# Patient Record
Sex: Male | Born: 1960 | Race: White | Hispanic: No | Marital: Married | Smoking: Never smoker
Health system: Southern US, Community
[De-identification: ages and names within clinical notes are randomized; demographics above are authoritative.]

## PROBLEM LIST (undated history)

## (undated) DIAGNOSIS — T783XXA Angioneurotic edema, initial encounter: Secondary | ICD-10-CM

## (undated) DIAGNOSIS — J4520 Mild intermittent asthma, uncomplicated: Secondary | ICD-10-CM

## (undated) HISTORY — DX: Mild intermittent asthma, uncomplicated: J45.20

## (undated) HISTORY — PX: SINOSCOPY: SHX187

## (undated) HISTORY — DX: Angioneurotic edema, initial encounter: T78.3XXA

---

## 2001-01-04 ENCOUNTER — Ambulatory Visit (HOSPITAL_BASED_OUTPATIENT_CLINIC_OR_DEPARTMENT_OTHER): Admission: RE | Admit: 2001-01-04 | Discharge: 2001-01-04 | Payer: Self-pay | Admitting: Otolaryngology

## 2002-12-13 HISTORY — PX: OTHER SURGICAL HISTORY: SHX169

## 2002-12-13 HISTORY — PX: NASAL SEPTUM SURGERY: SHX37

## 2010-03-05 ENCOUNTER — Encounter: Admission: RE | Admit: 2010-03-05 | Discharge: 2010-03-05 | Payer: Self-pay | Admitting: Family Medicine

## 2014-03-08 ENCOUNTER — Emergency Department: Payer: Self-pay | Admitting: Emergency Medicine

## 2018-08-21 ENCOUNTER — Other Ambulatory Visit: Payer: Self-pay | Admitting: Family Medicine

## 2018-08-21 DIAGNOSIS — R1310 Dysphagia, unspecified: Secondary | ICD-10-CM

## 2018-09-11 ENCOUNTER — Ambulatory Visit
Admission: RE | Admit: 2018-09-11 | Discharge: 2018-09-11 | Disposition: A | Discharge status: Home / Self Care | Payer: Managed Care, Other (non HMO) | Source: Ambulatory Visit | Attending: Family Medicine | Admitting: Family Medicine

## 2018-09-11 DIAGNOSIS — R1310 Dysphagia, unspecified: Secondary | ICD-10-CM

## 2018-12-26 ENCOUNTER — Ambulatory Visit (INDEPENDENT_AMBULATORY_CARE_PROVIDER_SITE_OTHER): Payer: Managed Care, Other (non HMO) | Admitting: Allergy & Immunology

## 2018-12-26 ENCOUNTER — Encounter: Payer: Self-pay | Admitting: Allergy & Immunology

## 2018-12-26 VITALS — BP 112/76 | HR 74 | Temp 97.9°F | Resp 16 | Ht 69.5 in | Wt 190.8 lb

## 2018-12-26 DIAGNOSIS — J302 Other seasonal allergic rhinitis: Secondary | ICD-10-CM | POA: Diagnosis not present

## 2018-12-26 DIAGNOSIS — J3089 Other allergic rhinitis: Secondary | ICD-10-CM | POA: Diagnosis not present

## 2018-12-26 DIAGNOSIS — J4520 Mild intermittent asthma, uncomplicated: Secondary | ICD-10-CM | POA: Diagnosis not present

## 2018-12-26 HISTORY — DX: Mild intermittent asthma, uncomplicated: J45.20

## 2018-12-26 MED ORDER — FLUTICASONE PROPIONATE 93 MCG/ACT NA EXHU: 2.0000 | INHALANT_SUSPENSION | Freq: Two times a day (BID) | NASAL | 5 refills | Status: DC

## 2018-12-26 NOTE — Patient Instructions (Addendum)
1. Seasonal and perennial allergic rhinitis - Testing today showed: grasses, indoor molds, outdoor molds and dog - Copy of test results provided.  - Avoidance measures provided. - Start taking: Xyzal (levocetirizine) 5mg  tablet once daily and Xhance (fluticasone) 1-2 sprays per nostril twice daily - You can use an extra dose of the antihistamine, if needed, for breakthrough symptoms.  - Consider nasal saline rinses 1-2 times daily to remove allergens from the nasal cavities as well as help with mucous clearance (this is especially helpful to do before the nasal sprays are given) - Consider allergy shots as a means of long-term control. - Allergy shots "re-train" and "reset" the immune system to ignore environmental allergens and decrease the resulting immune response to those allergens (sneezing, itchy watery eyes, runny nose, nasal congestion, etc).    - Allergy shots improve symptoms in 75-85% of patients.  - We can discuss more at the next appointment if the medications are not working for you.  2. Return in about 3 months (around 03/27/2019).   Please inform us of any Emergency Department visits, hospitalizations, or changes in symptoms. Call us before going to the ED for breathing or allergy symptoms since we might be able to fit you in for a sick visit. Feel free to contact us anytime with any questions, problems, or concerns.  It was a pleasure to meet you today!  Websites that have reliable patient information: 1. American Academy of Asthma, Allergy, and Immunology: www.aaaai.org 2. Food Allergy Research and Education (FARE): foodallergy.org 3. Mothers of Asthmatics: http://www.asthmacommunitynetwork.org 4. American College of Allergy, Asthma, and Immunology: MissingWeapons.ca   Make sure you are registered to vote! If you have moved or changed any of your contact information, you will need to get this updated before voting!    Voter ID laws are going into effect for the General  Election in November 2020! Be prepared! Check out LandscapingDigest.dk for more details.       Control of Mold Allergen   Mold and fungi can grow on a variety of surfaces provided certain temperature and moisture conditions exist.  Outdoor molds grow on plants, decaying vegetation and soil.  The major outdoor mold, Alternaria and Cladosporium, are found in very high numbers during hot and dry conditions.  Generally, a late Summer - Fall peak is seen for common outdoor fungal spores.  Rain will temporarily lower outdoor mold spore count, but counts rise rapidly when the rainy period ends.  The most important indoor molds are Aspergillus and Penicillium.  Dark, humid and poorly ventilated basements are ideal sites for mold growth.  The next most common sites of mold growth are the bathroom and the kitchen.  Outdoor (Seasonal) Mold Control  Positive outdoor molds via skin testing: Alternaria, Cladosporium, Bipolaris (Helminthsporium), Drechslera (Curvalaria) and Mucor  1. Use air conditioning and keep windows closed 2. Avoid exposure to decaying vegetation. 3. Avoid leaf raking. 4. Avoid grain handling. 5. Consider wearing a face mask if working in moldy areas.  6.   Indoor (Perennial) Mold Control   Positive indoor molds via skin testing: Aspergillus, Penicillium, Fusarium, Aureobasidium (Pullulara) and Rhizopus  1. Maintain humidity below 50%. 2. Clean washable surfaces with 5% bleach solution. 3. Remove sources e.g. contaminated carpets.    Reducing Pollen Exposure  The American Academy of Allergy, Asthma and Immunology suggests the following steps to reduce your exposure to pollen during allergy seasons.    1. Do not hang sheets or clothing out to dry; pollen may collect  on these items. 2. Do not mow lawns or spend time around freshly cut grass; mowing stirs up pollen. 3. Keep windows closed at night.  Keep car windows closed while driving. 4. Minimize morning  activities outdoors, a time when pollen counts are usually at their highest. 5. Stay indoors as much as possible when pollen counts or humidity is high and on windy days when pollen tends to remain in the air longer. 6. Use air conditioning when possible.  Many air conditioners have filters that trap the pollen spores. 7. Use a HEPA room air filter to remove pollen form the indoor air you breathe.  Control of Dog or Cat Allergen  Avoidance is the best way to manage a dog or cat allergy. If you have a dog or cat and are allergic to dog or cats, consider removing the dog or cat from the home. If you have a dog or cat but don't want to find it a new home, or if your family wants a pet even though someone in the household is allergic, here are some strategies that may help keep symptoms at bay:  1. Keep the pet out of your bedroom and restrict it to only a few rooms. Be advised that keeping the dog or cat in only one room will not limit the allergens to that room. 2. Don't pet, hug or kiss the dog or cat; if you do, wash your hands with soap and water. 3. High-efficiency particulate air (HEPA) cleaners run continuously in a bedroom or living room can reduce allergen levels over time. 4. Regular use of a high-efficiency vacuum cleaner or a central vacuum can reduce allergen levels. 5. Giving your dog or cat a bath at least once a week can reduce airborne allergen.

## 2018-12-26 NOTE — Progress Notes (Signed)
NEW PATIENT  Date of Service/Encounter:  12/26/18  Referring provider: Shaune PollackGates, Donna, MD (Inactive)   Assessment:   Seasonal and perennial allergic rhinitis (grasses, indoor molds, outdoor molds and dog)  Eustachian tube dysfunction  Plan/Recommendations:   1. Seasonal and perennial allergic rhinitis - Testing today showed: grasses, indoor molds, outdoor molds and dog - Copy of test results provided.  - Avoidance measures provided. - Start taking: Xyzal (levocetirizine) 5mg  tablet once daily and Xhance (fluticasone) 1-2 sprays per nostril twice daily - You can use an extra dose of the antihistamine, if needed, for breakthrough symptoms.  - Consider nasal saline rinses 1-2 times daily to remove allergens from the nasal cavities as well as help with mucous clearance (this is especially helpful to do before the nasal sprays are given) - Consider allergy shots as a means of long-term control. - Allergy shots "re-train" and "reset" the immune system to ignore environmental allergens and decrease the resulting immune response to those allergens (sneezing, itchy watery eyes, runny nose, nasal congestion, etc).    - Allergy shots improve symptoms in 75-85% of patients.  - We can discuss more at the next appointment if the medications are not working for you.  2. Return in about 3 months (around 03/27/2019).  Subjective:   Carlos Lindsey is a 58 y.o. male presenting today for evaluation of  Chief Complaint  Patient presents with  . New Patient (Initial Visit)  . Allergies  . Nasal Congestion    Carlos Lindsey has a history of the following: Patient Active Problem List   Diagnosis Date Noted  . Mild intermittent asthma, uncomplicated 12/26/2018  . Seasonal and perennial allergic rhinitis 12/26/2018    History obtained from: chart review and patient.  Carlos Lindsey was referred by Shaune PollackGates, Donna, MD (Inactive).     Carlos Lindsey is a 58 y.o. male presenting for an evaluation  of allergic rhinitis.    He was seen by Dr. Annalee GentaShoemaker in March 2019.  At that time, he was diagnosed with eustachian tube dysfunction and right ear fullness.  He also was complaining of muffled hearing and ear pressure.  There was no evidence of effusion or active infection.  He was continued with nasal saline rinses and Dymista to improve eustachian tube patency.   He was on allergy shots from 1968 through 1980. He was on them for 11 years total. He stopped because the "serum was too concentrated" and needed something stronger. He was told that he no longer needed to "do it". Symptoms have not gotten much worse since that time. [place] allerg[place](Claude Fraiser). He has not seen an allergist since 1980. He was definitely allergic to trees, grasses, animal hair, dust, and other pollens.   He has noticed that when he goes to a new environment with a variety of different pollens, he will have horrible allergy symptoms with ocular involvement and rhinorrhea. He travels over [place] for his job and he notices that when he stays at different hotels, his symptoms worsen. He does get antibiotics around twice per year. He did have a septal deviation years and his infections decreased quite a bit. Symptoms are now worse in the spring and the fall. He d  Now he is on Flonase. He did not continue with Dymista since he needed a refill. He is unsure whether the Dymista worked any better than the Flonase alone.   He does have some irritation with increased drainage. He does  use a rescue inhaler often at all, he estimates every 6 months or so. He did have food allergies when he saw the allergist in [place]. However it does not seem that he ever had any clinical symptoms of food allergies.     Otherwise, there is no history of other atopic diseases, including asthma, drug allergies, stinging insect allergies, eczema or urticaria. There is no significant infectious history. Vaccinations are up to  date.    Past Medical History: Patient Active Problem List   Diagnosis Date Noted  . Mild intermittent asthma, uncomplicated 12/26/2018  . Seasonal and perennial allergic rhinitis 12/26/2018    Medication List:  Allergies as of 12/26/2018      Reactions   Lisinopril Swelling   Facial Swelling    Penicillins Diarrhea      Medication List       Accurate as of December 26, 2018  7:41 PM. Always use your most recent med list.        amLODipine-atorvastatin 5-10 MG tablet Commonly known as:  CADUET Take 1 tablet by mouth daily.   atorvastatin 10 MG tablet Commonly known as:  LIPITOR Take 10 mg by mouth daily.   Fluticasone Propionate 93 MCG/ACT Exhu Commonly known as:  XHANCE Place 2 sprays into the nose 2 (two) times daily.   lansoprazole 30 MG capsule Commonly known as:  PREVACID Take 30 mg by mouth daily at 12 noon. Take 1 tablet every 2 days   Melatonin 2.5 MG Chew Chew 1 tablet by mouth as needed.   pseudoephedrine-acetaminophen 30-500 MG Tabs tablet Commonly known as:  TYLENOL SINUS Take 1 tablet by mouth daily.       Birth History: non-contributory  Developmental History: non-contributory.   Past Surgical History: Past Surgical History:  Procedure Laterality Date  . NASAL SEPTUM SURGERY N/A 2004  . SINOSCOPY    . uvula removal N/A 2004     Family History: Family History  Problem Relation Age of Onset  . Allergic rhinitis Mother   . Asthma Father   . Allergic rhinitis Brother      Social History: Carlos Lindsey lives at home with his family. He lives in a house that is 59 years old. There is carpeting in the main living areas and wood in the bedrooms. He has gas heating and central cooling. There are dust mite coverings on the bedding including the pillows. There is no tobacco exposure. 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      Review of  Systems: a 14-point review of systems is pertinent for what is mentioned in HPI.  Otherwise, all other systems were negative. Constitutional: negative other than that listed in the HPI Eyes: negative other than that listed in the HPI Ears, nose, mouth, throat, and face: negative other than that listed in the HPI Respiratory: negative other than that listed in the HPI Cardiovascular: negative other than that listed in the HPI Gastrointestinal: negative other than that listed in the HPI Genitourinary: negative other than that listed in the HPI Integument: negative other than that listed in the HPI Hematologic: negative other than that listed in the HPI Musculoskeletal: negative other than that listed in the HPI Neurological: negative other than that listed in the HPI Allergy/Immunologic: negative other than that listed in the HPI    Objective:   Blood pressure 112/76, pulse 74, temperature 97.9 F (36.6 C), temperature source Oral, resp. rate  16, height 5' 9.5" (1.765 m), weight 190 lb 12.8 oz (86.5 kg), SpO2 96 %. Body mass index is 27.77 kg/m.   Physical Exam:  General: Alert, interactive, in no acute distress. Pleasant male.  Eyes: No conjunctival injection bilaterally, no discharge on the right, no discharge on the left and no Horner-Trantas dots present. PERRL bilaterally. EOMI without pain. No photophobia.  Ears: Right TM pearly gray with normal light reflex, Left TM pearly gray with normal light reflex, Right TM intact without perforation and Left TM intact without perforation.  Nose/Throat: External nose within normal limits and nasal crease present. Turbinates edematous and pale with clear discharge. Posterior oropharynx erythematous with cobblestoning in the posterior oropharynx. Tonsils 2+ without exudates.  Tongue without thrush. Neck: Supple without thyromegaly. Trachea midline. Adenopathy: no enlarged lymph nodes appreciated in the anterior cervical, occipital, axillary,  epitrochlear, inguinal, or popliteal regions. Lungs: Clear to auscultation without wheezing, rhonchi or rales. No increased work of breathing. CV: Normal S1/S2. No murmurs. Capillary refill <2 seconds.  Abdomen: Nondistended, nontender. No guarding or rebound tenderness. Bowel sounds present in all fields and hypoactive  Skin: Warm and dry, without lesions or rashes. Extremities:  No clubbing, cyanosis or edema. Neuro:   Grossly intact. No focal deficits appreciated. Responsive to questions.  Diagnostic studies:   Allergy Studies:   Airborne Adult Perc - 01-13-19 1503    Time Antigen Placed  1500    Allergen Manufacturer  Waynette Buttery    Location  Back    Number of Test  59    Panel 1  Select    1. Control-Buffer 50% Glycerol  Negative    2. Control-Histamine 1 mg/ml  2+    3. Albumin saline  Negative    4. Bahia  Negative    5. [place]  Negative    6. Johnson  Negative    7. Kentucky Blue  Negative    8. Meadow Fescue  Negative    9. Perennial Rye  Negative    10. Sweet Vernal  Negative    11. Timothy  Negative    12. Cocklebur  Negative    13. Burweed Marshelder  Negative    14. Ragweed, short  Negative    15. Ragweed, Giant  Negative    16. Plantain,  English  Negative    17. Lamb's Quarters  Negative    18. Sheep Sorrell  Negative    19. Rough Pigweed  Negative    20. Marsh Elder, Rough  Negative    21. Mugwort, Common  Negative    22. Ash mix  Negative    23. Birch mix  Negative    24. Beech American  Negative    25. Box, Elder  Negative    26. Cedar, red  Negative    27. Cottonwood, Guinea-Bissau  Negative    28. Elm mix  Negative    29. Hickory mix  Negative    30. Maple mix  Negative    31. Oak, Guinea-Bissau mix  Negative    32. Pecan Pollen  Negative    33. Pine mix  Negative    34. Sycamore Eastern  Negative    35. Walnut, Black Pollen  Negative    36. Alternaria alternata  Negative    37. Cladosporium Herbarum  Negative    38. Aspergillus mix  Negative    39.  Penicillium mix  Negative    40. Bipolaris sorokiniana (Helminthosporium)  Negative    41. Drechslera spicifera (  Curvularia)  Negative    42. Mucor plumbeus  Negative    43. Fusarium moniliforme  Negative    44. Aureobasidium pullulans (pullulara)  Negative    45. Rhizopus oryzae  Negative    46. Botrytis cinera  Negative    47. Epicoccum nigrum  Negative    48. Phoma betae  Negative    49. Candida Albicans  Negative    50. Trichophyton mentagrophytes  Negative    51. Mite, D Farinae  5,000 AU/ml  Negative    52. Mite, D Pteronyssinus  5,000 AU/ml  Negative    53. Cat Hair 10,000 BAU/ml  Negative    54.  Dog Epithelia  Negative    55. Mixed Feathers  Negative    56. Horse Epithelia  Negative    57. Cockroach, German  Negative    58. Mouse  Negative    59. Tobacco Leaf  Negative     Intradermal - 12/26/18 1541    Time Antigen Placed  1540    Allergen Manufacturer  Waynette ButteryGreer    Location  Arm    Number of Test  15    Intradermal  Select    Control  Negative    French Southern Territ[place]  1+    Johnson  1+    7 Grass  1+    Ragweed mix  Negative    Weed mix  Negative    Tree mix  Negative    Mold 1  3+    Mold 2  3+    Mold 3  2+    Mold 4  2+    Cat  Negative    Dog  1+    Cockroach  Negative    Mite mix  Negative        Allergy testing results were read and interpreted by myself, documented by clinical staff.       Malachi BondsJoel Leah Thornberry, MD [facility]

## 2019-03-27 ENCOUNTER — Ambulatory Visit: Payer: Managed Care, Other (non HMO) | Admitting: Allergy & Immunology

## 2019-03-29 ENCOUNTER — Ambulatory Visit: Payer: Managed Care, Other (non HMO) | Admitting: Allergy & Immunology

## 2019-04-26 ENCOUNTER — Ambulatory Visit (INDEPENDENT_AMBULATORY_CARE_PROVIDER_SITE_OTHER): Payer: Managed Care, Other (non HMO) | Admitting: Allergy & Immunology

## 2019-04-26 ENCOUNTER — Other Ambulatory Visit: Payer: Self-pay

## 2019-04-26 ENCOUNTER — Encounter: Payer: Self-pay | Admitting: Allergy & Immunology

## 2019-04-26 VITALS — BP 132/74 | HR 76 | Temp 97.5°F | Resp 16

## 2019-04-26 DIAGNOSIS — J3089 Other allergic rhinitis: Secondary | ICD-10-CM | POA: Diagnosis not present

## 2019-04-26 DIAGNOSIS — J302 Other seasonal allergic rhinitis: Secondary | ICD-10-CM

## 2019-04-26 DIAGNOSIS — H6123 Impacted cerumen, bilateral: Secondary | ICD-10-CM | POA: Diagnosis not present

## 2019-04-26 MED ORDER — MONTELUKAST SODIUM 10 MG PO TABS: 10.0000 mg | ORAL_TABLET | Freq: Every day | ORAL | 5 refills | Status: DC

## 2019-04-26 MED ORDER — LEVOCETIRIZINE DIHYDROCHLORIDE 5 MG PO TABS: 5.0000 mg | ORAL_TABLET | Freq: Every evening | ORAL | 5 refills | Status: DC

## 2019-04-26 MED ORDER — EPINEPHRINE 0.3 MG/0.3ML IJ SOAJ: 0.3000 mg | INTRAMUSCULAR | 1 refills | Status: AC | PRN

## 2019-04-26 MED ORDER — BUDESONIDE 0.25 MG/2ML IN SUSP
RESPIRATORY_TRACT | 5 refills | Status: DC
Start: 2019-04-26 — End: 2019-06-06

## 2019-04-26 NOTE — Patient Instructions (Addendum)
1. Seasonal and perennial allergic rhinitis (grasses, indoor molds, outdoor molds and dog) - Stop the Flonase and start budesonide nasal rinses twice daily (see recipe below). - Start the Xyzal 5mg  and montelukast 10mg  daily. - Information on allergy shots provided.  - Talk to your insurance company about coverage.   2. Cerumen impaction - Cerumen cleared bilaterally with instrumentation. - Syringe provided and rinsing with 1:1 hydrogen peroxide and water recommended. - Use of olive oil as a lubricant for natural cerumen removal discussed as well.  3. Return in about 3 months (around 07/27/2019). This can be an in-person, a virtual Webex or a telephone follow up visit.   Please inform us of any Emergency Department visits, hospitalizations, or changes in symptoms. Call us before going to the ED for breathing or allergy symptoms since we might be able to fit you in for a sick visit. Feel free to contact us anytime with any questions, problems, or concerns.  It was a pleasure to see you again today!  Websites that have reliable patient information: 1. American Academy of Asthma, Allergy, and Immunology: www.aaaai.org 2. Food Allergy Research and Education (FARE): foodallergy.org 3. Mothers of Asthmatics: http://www.asthmacommunitynetwork.org 4. American College of Allergy, Asthma, and Immunology: www.acaai.org  "Like" [place] on Facebook and Instagram for our latest updates!      Make sure you are registered to vote! If you have moved or changed any of your contact information, you will need to get this updated before voting!    Voter ID laws are NOT going into effect for the General Election in November 2020! DO NOT let this stop you from exercising your right to vote!    Budesonide (Pulmicort) + Saline Irrigation/Rinse   Budesonide (Pulmicort) is an anti-inflammatory steroid medication used to decrease nasal and sinus inflammation. It is dispensed in liquid form in a vial. Although it  is manufactured for use with a nebulizer, we intend for you to use it with the NeilMed Sinus Rinse bottle (preferred) or a Neti pot.    Instructions:  1) Make 240cc of saline in the NeilMed bottle using the salt packets or your own saline recipe (see separate handout).  2) Add the entire 2cc vial of liquid Budesonide (Pulmicort) to the rinse bottle and mix together.  3) While in the shower or over the sink, tilt your head forward to a comfortable level. Put the tip of the sinus rinse bottle in your nostril and aim it towards the crown or top of your head. Gently squeeze the bottle to flush out your nose. The fluid will circulate in and out of your sinus cavities, coming back out from either nostril or through your mouth. Try not to swallow large quantities and spit it out instead.  4) Perform Budesonide (Pulmicort) + Saline irrigations 2 times daily.

## 2019-04-26 NOTE — Progress Notes (Signed)
FOLLOW UP  Date of Service/Encounter:  04/26/19   Assessment:   Seasonal and perennial allergic rhinitis (grasses, indoor molds, outdoor molds and dog)  Eustachian tube dysfunction  Bilateral cerumen impaction - cleared in clinic today   Carlos Lindsey remains essentially unchanged since I last saw him.  Different nasal spray we tried him on did not do any better than fluticasone, so he is gone back to the fluticasone only.  He is not really taking his antihistamine and would like a prescription sent in for this.  We are also going to add on montelukast.  Regarding his nasal spray, we will initiate budesonide nasal rinses twice daily.  We did provide a recipe for the salt water and sent in a prescription for the budesonide.  He does need a NeilMed bottle to use for the lavages themselves.  We also discussed allergen immunotherapy and he did sign a consent to start.  He has been on this in the distant past and is open to that since nothing else seems to have worked.  He is good to check with his insurance company regarding coverage and give [place] a call back when he makes a decision.  Plan/Recommendations:   1. Seasonal and perennial allergic rhinitis (grasses, indoor molds, outdoor molds and dog) - Stop the Flonase and start budesonide nasal rinses twice daily (see recipe below). - Start the Xyzal 5mg  and montelukast 10mg  daily. - Information on allergy shots provided.  - Talk to your insurance company about coverage.   2. Cerumen impaction - Cerumen cleared bilaterally with instrumentation. - Syringe provided and rinsing with 1:1 hydrogen peroxide and water recommended. - Use of olive oil as a lubricant for natural cerumen removal discussed as well.  3. Return in about 3 months (around 07/27/2019). This can be an in-person, a virtual Webex or a telephone follow up visit.  Subjective:   Carlos Lindsey is a 58 y.o. male presenting today for follow up of  Chief Complaint  Patient  presents with  . Allergic Rhinitis     he has had some allergy issues on a pretty constant basis. has lots of post nasal drainage.     Carlos Lindsey has a history of the following: Patient Active Problem List   Diagnosis Date Noted  . Mild intermittent asthma, uncomplicated 12/26/2018  . Seasonal and perennial allergic rhinitis 12/26/2018    History obtained from: chart review and patient.  PCP: Dr. Shaune Pollack ENT: Dr. Osborn Coho  Carlos Lindsey is a 58 y.o. male presenting for a follow up visit.  He has a history of seasonal and perennial allergic rhinitis and eustachian tube dysfunction.  I saw him in January 2020 as a new patient.  At that time, testing showed sensitizations to grasses, indoor and outdoor molds, and dog.  We did provide avoidance measures and started him on Xyzal 5 mg daily and Xhance 1 to 2 sprays per nostril up to twice daily.  We did discuss allergen immunotherapy as well.  He had previously been on allergy shots from 1968 through 1980.  Since the last visit, he has not really changed much. He tells me that the [place] did nothing to help his symptoms. He is now using the Flonase twice daily. He has never done budesonide trinses but he has done Netti pots in the past. They do not seem to do much.   He has not seen Dr. Annalee Genta in a while. He did have a sinus CT at one point.  He tells me htat he has a zit on the left sisde of his nose. He did undergo a septoplasty at one point and had a uvular removal 15 years ago.   Otherwise, there have been no changes to his past medical history, surgical history, family history, or social history.    Review of Systems  Constitutional: Negative.  Negative for fever, malaise/fatigue and weight loss.  HENT: Positive for congestion. Negative for ear discharge and ear pain.        Positive for ear fullness.  Eyes: Negative for pain, discharge and redness.  Respiratory: Negative for cough, sputum production, shortness of breath  and wheezing.   Cardiovascular: Negative.  Negative for chest pain and palpitations.  Gastrointestinal: Negative for abdominal pain and heartburn.  Skin: Negative.  Negative for itching and rash.  Neurological: Negative for dizziness and headaches.  Endo/Heme/Allergies: Negative for environmental allergies. Does not bruise/bleed easily.       Objective:   Blood pressure 132/74, pulse 76, temperature (!) 97.5 F (36.4 C), temperature source Oral, resp. rate 16, SpO2 96 %. There is no height or weight on file to calculate BMI.   Physical Exam:  Physical Exam  Constitutional: He appears well-developed.  HENT:  Head: Normocephalic and atraumatic.  Right Ear: Tympanic membrane, external ear and ear canal normal.  Left Ear: Tympanic membrane and ear canal normal.  Nose: No mucosal edema, rhinorrhea, nasal deformity or septal deviation. No epistaxis. Right sinus exhibits no maxillary sinus tenderness and no frontal sinus tenderness. Left sinus exhibits no maxillary sinus tenderness and no frontal sinus tenderness.  Mouth/Throat: Uvula is midline and oropharynx is clear and moist. Mucous membranes are not pale and not dry.  There is cerumen bilaterally that was obstructing the view of the tympanic membrane.  This was removed via instrumentation and patient tolerated the procedure well.  Eyes: Pupils are equal, round, and reactive to light. Conjunctivae and EOM are normal. Right eye exhibits no chemosis and no discharge. Left eye exhibits no chemosis and no discharge. Right conjunctiva is not injected. Left conjunctiva is not injected.  Cardiovascular: Normal rate, regular rhythm and normal heart sounds.  Respiratory: Effort normal and breath sounds normal. No accessory muscle usage. No tachypnea. No respiratory distress. He has no wheezes. He has no rhonchi. He has no rales. He exhibits no tenderness.  Lymphadenopathy:    He has no cervical adenopathy.  Neurological: He is alert.  Skin: No  abrasion, no petechiae and no rash noted. Rash is not papular, not vesicular and not urticarial. No erythema. No pallor.  Psychiatric: He has a normal mood and affect.     Diagnostic studies: none   Procedures: Cerumen removed bilaterally using instrumentation.  Patient tolerated the procedure well.  There was no bleeding.  No flushing was required.      Malachi BondsJoel Cheralyn Oliver, MD  [facility]

## 2019-05-15 IMAGING — RF DG ESOPHAGUS
8 series · 14 of 24 positions shown · non-contrast
Comparison: 03/05/2010 esophagram

CLINICAL DATA: Chronic dysphagia with sticking sensation in the
lower chest with food.

EXAM:
ESOPHOGRAM / BARIUM SWALLOW / BARIUM TABLET STUDY
TECHNIQUE: Combined double contrast and single contrast examination performed
using effervescent crystals, thick barium liquid, and thin barium
liquid. The patient was observed with fluoroscopy swallowing a 13 mm
barium sulphate tablet.
FLUOROSCOPY TIME:  Fluoroscopy Time:  2 minutes 54 seconds
Radiation Exposure Index (if provided by the fluoroscopic device):
156 mGy
Number of Acquired Spot Images: 8

[Series 1: sequence · 2 of 14 frames shown (1 of 7)]
[frame 1/14]
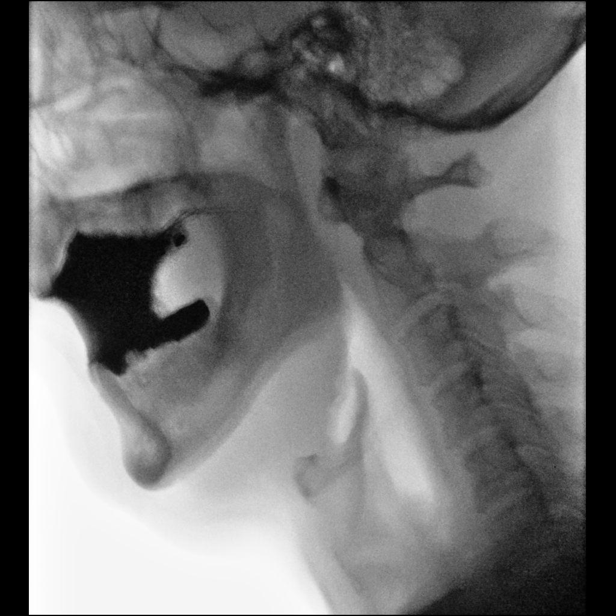
[frame 12/14]
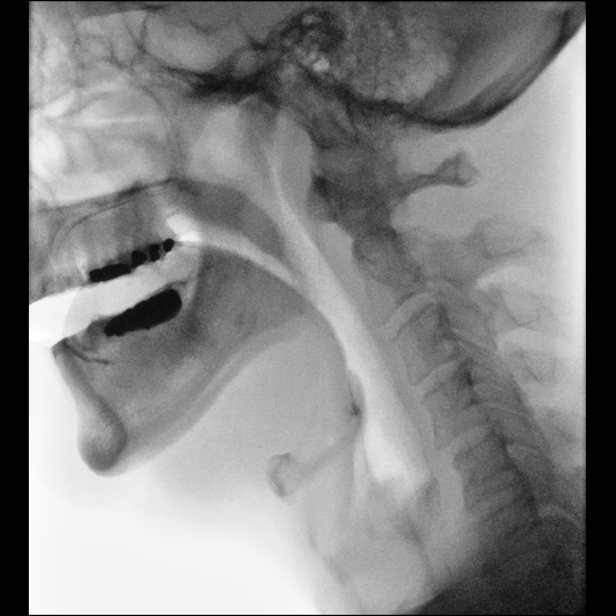

[Series 3: sequence · 1 of 9 frames shown (2 of 7)]
[frame 3/9]
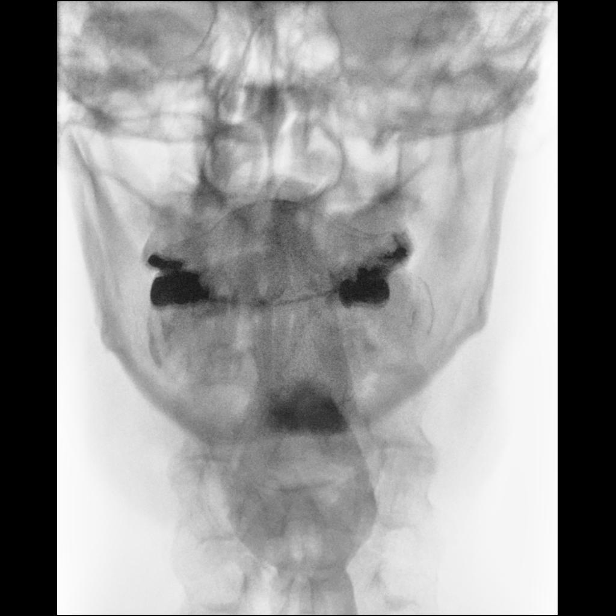

[Series 4: one shot · 0.15mm/px · 3 of 7 slices shown]
[im 1/7]
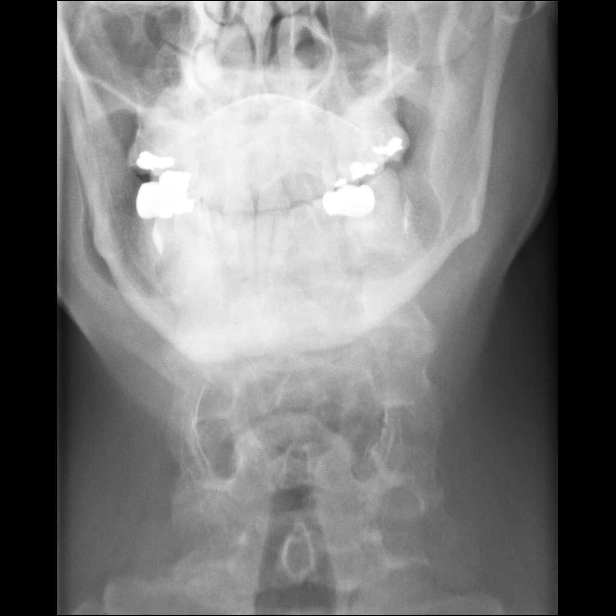
[im 3/7]
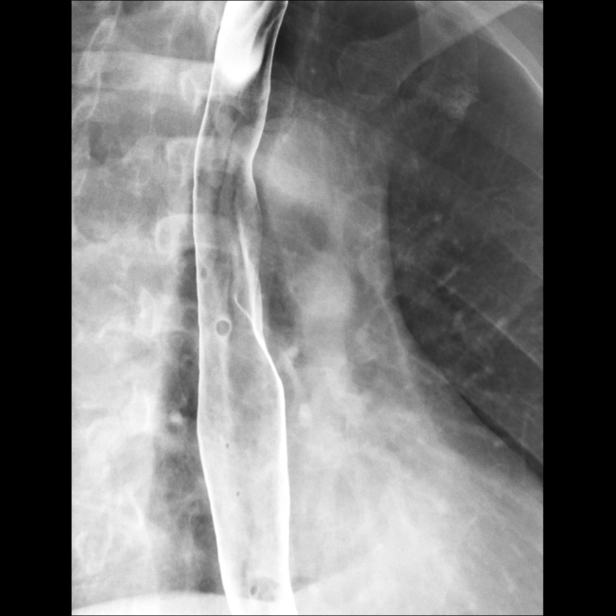
[im 6/7]
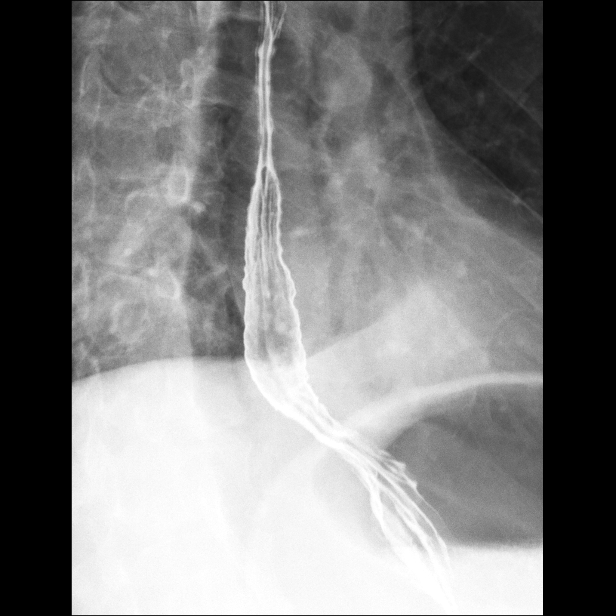

[Series 5: sequence · 2 of 33 frames shown (3 of 7)]
[frame 16/33]
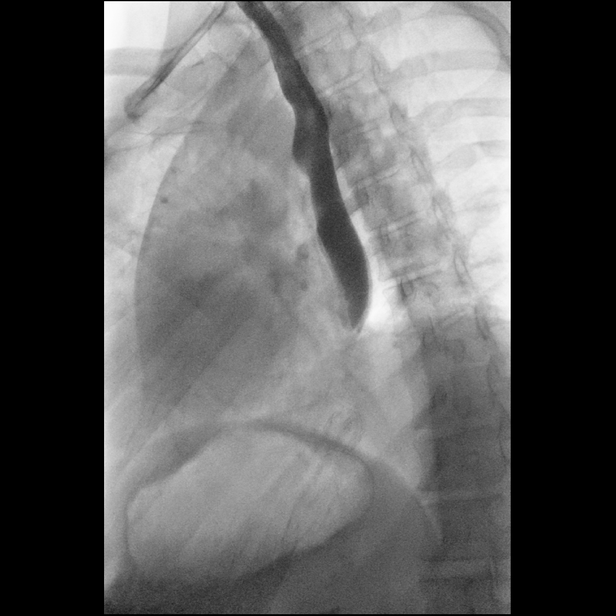
[frame 17/33]
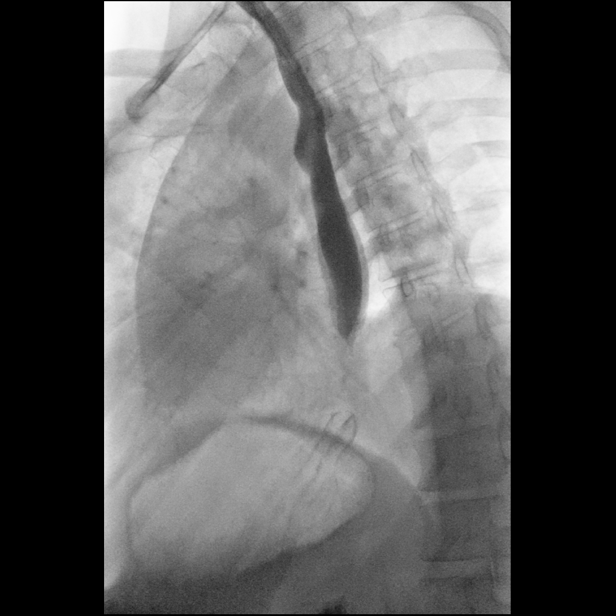

[Series 6: sequence · 1 of 79 frames shown (4 of 7)]
[frame 40/79]
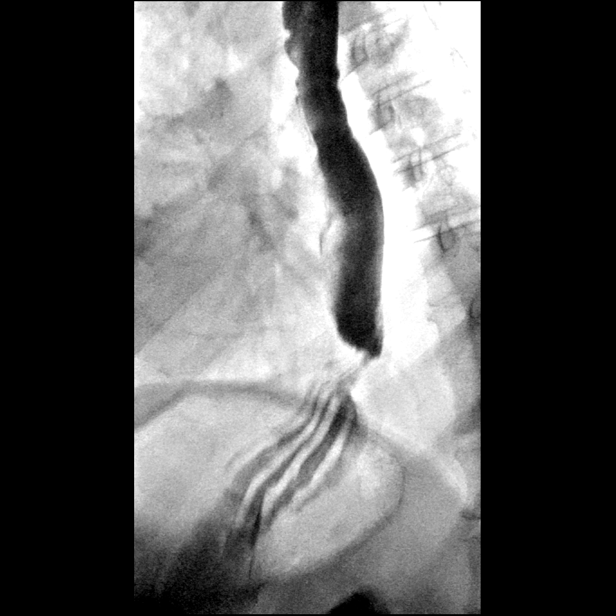

[Series 7: sequence · 2 of 16 frames shown (5 of 7)]
[frame 3/16]
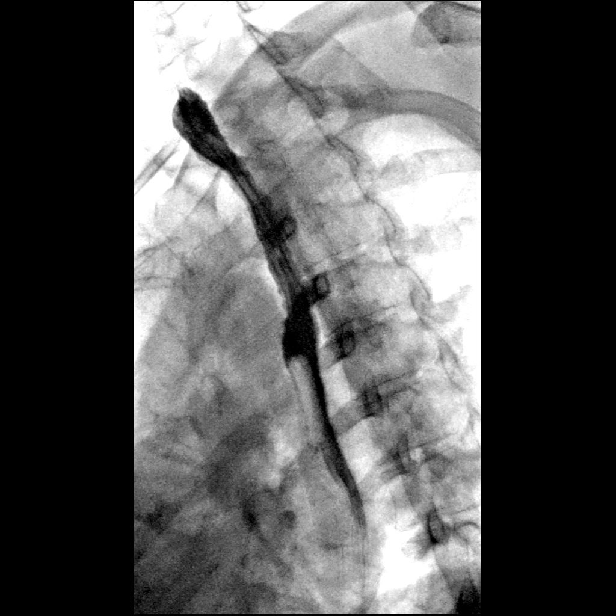
[frame 16/16]
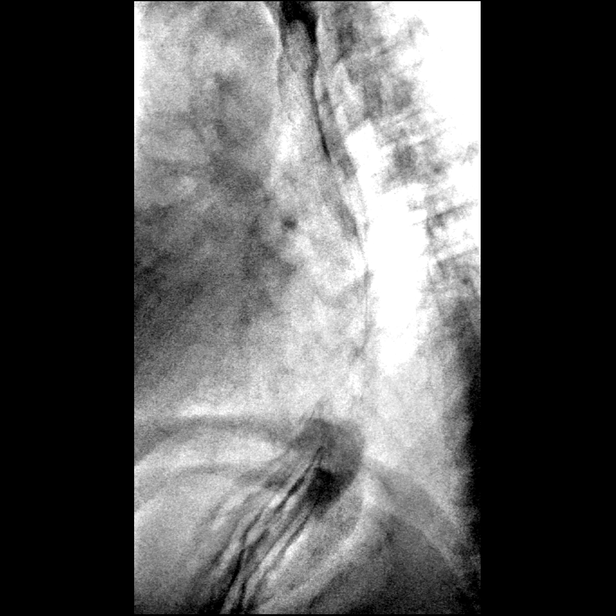

[Series 8: sequence · 1 of 27 frames shown (6 of 7)]
[frame 7/27]
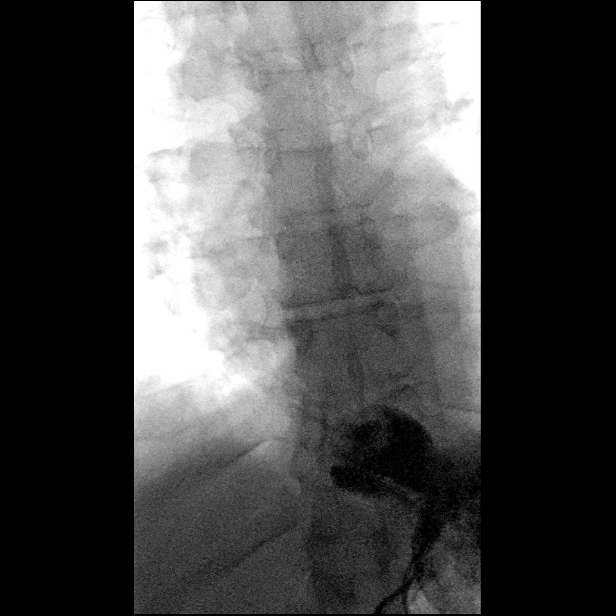

[Series 9: sequence · 2 of 28 frames shown (7 of 7)]
[frame 5/28]
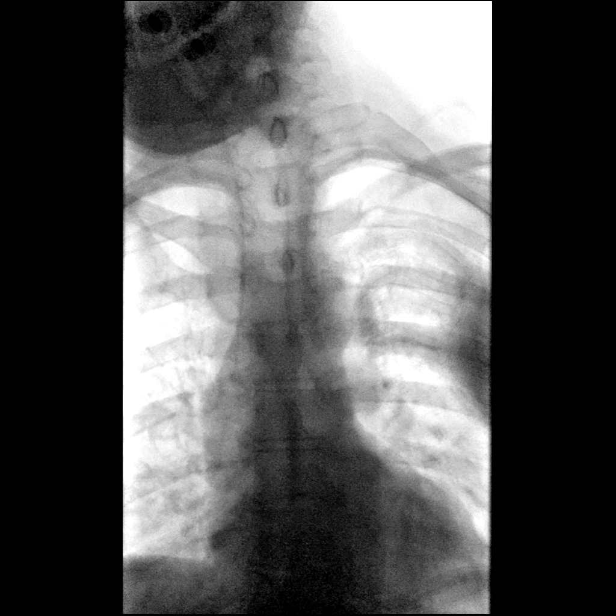
[frame 24/28]
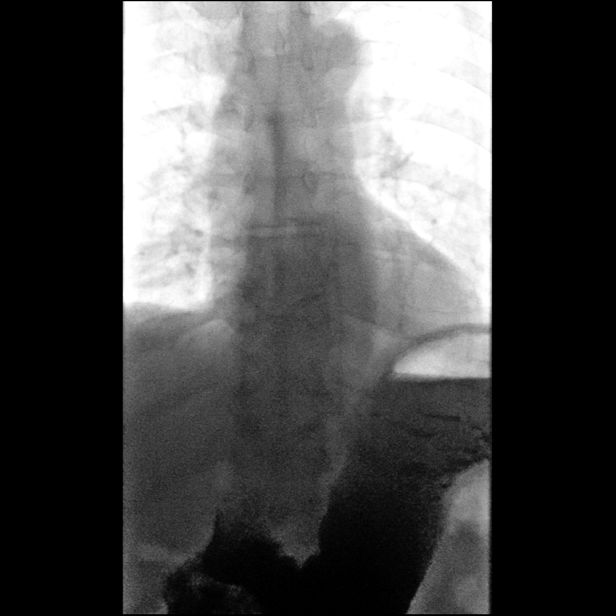

[14 of 24 positions shown; findings below may reference images not displayed]

FINDINGS: The oral and pharyngeal phases of swallowing are normal, with no
laryngeal penetration or tracheobronchial aspiration. No evidence of
pharyngeal mass, stricture or diverticulum. No significant barium
retention in the pharynx. No evidence of significant cricopharyngeus
muscle dysfunction.

Small to moderate hiatal hernia, not appreciably changed. Moderate
gastroesophageal reflux elicited to the level of the midthoracic
esophagus with deep breathing and Valsalva maneuvers. Mild
esophageal dysmotility, characterized by mild intermittent weakening
of primary peristalsis in the mid to lower thoracic esophagus.
Esophageal mucosa appears normal, with no evidence of esophageal
mass, significant stricture or ulcer. Barium tablet traversed the
esophagus into the stomach without delay. Gastric cardia appears
normal.
IMPRESSION: 1. Small to moderate hiatal hernia, stable.
2. Moderate gastroesophageal reflux.
3. Mild esophageal dysmotility, characteristic of chronic reflux
related dysmotility.
4. No evidence of reflux esophagitis. No esophageal mass or
significant esophageal stricture. Barium tablet traversed the
esophagus into the stomach without delay.

## 2019-06-06 ENCOUNTER — Other Ambulatory Visit: Payer: Self-pay | Admitting: Allergy & Immunology

## 2019-07-19 ENCOUNTER — Encounter: Payer: Self-pay | Admitting: Allergy & Immunology

## 2019-07-19 ENCOUNTER — Ambulatory Visit (INDEPENDENT_AMBULATORY_CARE_PROVIDER_SITE_OTHER): Payer: Managed Care, Other (non HMO) | Admitting: Allergy & Immunology

## 2019-07-19 ENCOUNTER — Other Ambulatory Visit: Payer: Self-pay

## 2019-07-19 VITALS — BP 120/82 | HR 88 | Temp 97.9°F | Resp 16 | Ht 70.0 in | Wt 180.6 lb

## 2019-07-19 DIAGNOSIS — R490 Dysphonia: Secondary | ICD-10-CM

## 2019-07-19 DIAGNOSIS — J302 Other seasonal allergic rhinitis: Secondary | ICD-10-CM

## 2019-07-19 DIAGNOSIS — J3089 Other allergic rhinitis: Secondary | ICD-10-CM | POA: Diagnosis not present

## 2019-07-19 DIAGNOSIS — K219 Gastro-esophageal reflux disease without esophagitis: Secondary | ICD-10-CM | POA: Diagnosis not present

## 2019-07-19 MED ORDER — IPRATROPIUM BROMIDE 0.06 % NA SOLN: 2.0000 | Freq: Three times a day (TID) | NASAL | 5 refills | Status: DC

## 2019-07-19 NOTE — Progress Notes (Signed)
FOLLOW UP  Date of Service/Encounter:  07/19/19   Assessment:   Seasonal and perennial allergic rhinitis(grasses, indoor molds, outdoor molds and dog)  Eustachian tube dysfunction  GERD  Plan/Recommendations:   1. Seasonal and perennial allergic rhinitis (grasses, indoor molds, outdoor molds and dog) - Continue with budesonide nasal rinses up to twice daily. - Add on nasal ipratropium one spray per nostril every 8 hours as needed for runny noses (this can be overdrying). - Just use the nasal ipratropium when the runny nose is particularly bad.  - Continue with Xyzal 5mg  and montelukast 10mg  daily. - Consider allergy shots in the future.  2. GERD - Continue with lansoprazole 30mg  and do this every day for the best effect.  - Some of this could be related to the oral device.   3. Return in about 6 months (around 01/19/2020). This can be an in-person, a virtual Webex or a telephone follow up visit.  Subjective:   Carlos Lindsey is a 58 y.o. male presenting today for follow up of  Chief Complaint  Patient presents with  . Allergic Rhinitis     Carlos Lindsey has a history of the following: Patient Active Problem List   Diagnosis Date Noted  . Gastroesophageal reflux disease 07/19/2019  . Hoarseness 07/19/2019  . Mild intermittent asthma, uncomplicated 52/77/8242  . Seasonal and perennial allergic rhinitis 12/26/2018    History obtained from: chart review and patient.  Carlos Lindsey is a 58 y.o. male presenting for a follow up visit.  He was last seen in May 2020.  At that time, he continued to have allergic rhinitis symptoms despite Flonase.  We started him on budesonide nasal rinses and Xyzal 5 mg and montelukast 10 mg. We did talk about allergy shots and provided information so he can talk to his insurance company.  We also remove cerumen at the last visit.  Since the last visit, he has done well. He is on the budeonide nasal rinses 1-2 times per day. He is unsure  whrther it is better than fluticasone. He has not needed antibiootics in a while. He just refilled his budesonide, therefore he is not interested in changing back to fluticasone at this time. He is on the Xyzal which he is taking at night. He has not noticed that there is any increased sleepiness from the Xyzal and he thinks that it has helped somewhat with his symptoms. He is also on montelukast daily which he feels has helped the nasal symptoms. He tells me that he has more rhinorrhea rather than congestion. Aside from the steroids, he has not been on any other nasal sprays including nasal antihistamines and Atrovent.   He does report that his main complaint is hoarseness today. It is worst in the morning. He does have GERD and is on a PPI. He only takes this 3-4 times per week. Symptoms are based on what he eats for the most part, which determines in turn when he takes his medications. This hoarsness that has been fairly consistent for 3-4 months. Sometimes there is a light cough with it and some clear sputum production. He does not have a history of breathing problems at all and has never needed an inhaler. He has not tried taking his PPI on a regular basis to see if this helps.   Otherwise, there have been no changes to his past medical history, surgical history, family history, or social history.    Review of Systems  Constitutional: Negative.  Negative  for fever, malaise/fatigue and weight loss.  HENT: Positive for congestion and sore throat. Negative for ear discharge and ear pain.        Positive for rhinorrhea.  Eyes: Negative for pain, discharge and redness.  Respiratory: Positive for cough. Negative for sputum production, shortness of breath and wheezing.        Positive for hoarseness.   Cardiovascular: Negative.  Negative for chest pain and palpitations.  Gastrointestinal: Positive for heartburn. Negative for abdominal pain, nausea and vomiting.  Skin: Negative.  Negative for itching  and rash.  Neurological: Negative for dizziness and headaches.  Endo/Heme/Allergies: Negative for environmental allergies. Does not bruise/bleed easily.       Objective:   Blood pressure 120/82, pulse 88, temperature 97.9 F (36.6 C), temperature source Temporal, resp. rate 16, height 5\' 10"  (1.778 m), weight 180 lb 9.6 oz (81.9 kg), SpO2 96 %. Body mass index is 25.91 kg/m.   Physical Exam:  Physical Exam  Constitutional: He appears well-developed.  Pleasant male.  HENT:  Head: Normocephalic and atraumatic.  Right Ear: Tympanic membrane, external ear and ear canal normal.  Left Ear: Tympanic membrane, external ear and ear canal normal.  Nose: Mucosal edema and rhinorrhea present. No nasal deformity or septal deviation. No epistaxis. Right sinus exhibits no maxillary sinus tenderness and no frontal sinus tenderness. Left sinus exhibits no maxillary sinus tenderness and no frontal sinus tenderness.  Mouth/Throat: Uvula is midline and oropharynx is clear and moist. Mucous membranes are not pale and not dry.  There is moderate cobblestoning present in the posterior oropharynx.   Eyes: Pupils are equal, round, and reactive to light. Conjunctivae and EOM are normal. Right eye exhibits no chemosis and no discharge. Left eye exhibits no chemosis and no discharge. Right conjunctiva is not injected. Left conjunctiva is not injected.  Cardiovascular: Normal rate, regular rhythm and normal heart sounds.  Respiratory: Effort normal and breath sounds normal. No accessory muscle usage. No tachypnea. No respiratory distress. He has no wheezes. He has no rhonchi. He has no rales. He exhibits no tenderness.  Moving air well in all lung fields.   Lymphadenopathy:    He has no cervical adenopathy.  Neurological: He is alert.  Skin: No abrasion, no petechiae and no rash noted. Rash is not papular, not vesicular and not urticarial. No erythema. No pallor.  No eczematous or urticarial lesions noted.   Psychiatric: He has a normal mood and affect.     Diagnostic studies: none      Carlos BondsJoel Shaunte Tuft, MD  [facility]

## 2019-07-19 NOTE — Patient Instructions (Addendum)
1. Seasonal and perennial allergic rhinitis (grasses, indoor molds, outdoor molds and dog) - Continue with budesonide nasal rinses up to twice daily. - Add on nasal ipratropium one spray per nostril every 8 hours as needed for runny noses (this can be overdrying). - Just use the nasal ipratropium when the runny nose is particularly bad.  - Continue with Xyzal 5mg  and montelukast 10mg  daily. - Consider allergy shots in the future.  2. GERD - Continue with lansoprazole 30mg  and do this every day for the best effect.  - Some of this could be related to the oral device.   3. Return in about 6 months (around 01/19/2020). This can be an in-person, a virtual Webex or a telephone follow up visit.   Please inform us of any Emergency Department visits, hospitalizations, or changes in symptoms. Call us before going to the ED for breathing or allergy symptoms since we might be able to fit you in for a sick visit. Feel free to contact us anytime with any questions, problems, or concerns.  It was a pleasure to see you again today!  Websites that have reliable patient information: 1. American Academy of Asthma, Allergy, and Immunology: www.aaaai.org 2. Food Allergy Research and Education (FARE): foodallergy.org 3. Mothers of Asthmatics: http://www.asthmacommunitynetwork.org 4. American College of Allergy, Asthma, and Immunology: www.acaai.org  "Like" [place] on Facebook and Instagram for our latest updates!      Make sure you are registered to vote! If you have moved or changed any of your contact information, you will need to get this updated before voting!    Voter ID laws are NOT going into effect for the General Election in November 2020! DO NOT let this stop you from exercising your right to vote!    Budesonide (Pulmicort) + Saline Irrigation/Rinse   Budesonide (Pulmicort) is an anti-inflammatory steroid medication used to decrease nasal and sinus inflammation. It is dispensed in liquid form in a  vial. Although it is manufactured for use with a nebulizer, we intend for you to use it with the NeilMed Sinus Rinse bottle (preferred) or a Neti pot.    Instructions:  1) Make 240cc of saline in the NeilMed bottle using the salt packets or your own saline recipe (see separate handout).  2) Add the entire 2cc vial of liquid Budesonide (Pulmicort) to the rinse bottle and mix together.  3) While in the shower or over the sink, tilt your head forward to a comfortable level. Put the tip of the sinus rinse bottle in your nostril and aim it towards the crown or top of your head. Gently squeeze the bottle to flush out your nose. The fluid will circulate in and out of your sinus cavities, coming back out from either nostril or through your mouth. Try not to swallow large quantities and spit it out instead.  4) Perform Budesonide (Pulmicort) + Saline irrigations 2 times daily.

## 2019-10-09 ENCOUNTER — Other Ambulatory Visit: Payer: Self-pay | Admitting: Allergy & Immunology

## 2019-11-17 ENCOUNTER — Other Ambulatory Visit: Payer: Self-pay | Admitting: Allergy & Immunology

## 2019-12-31 ENCOUNTER — Other Ambulatory Visit: Payer: Self-pay | Admitting: Allergy & Immunology

## 2020-04-16 ENCOUNTER — Other Ambulatory Visit: Payer: Self-pay | Admitting: Allergy & Immunology

## 2020-10-25 ENCOUNTER — Other Ambulatory Visit: Payer: Self-pay | Admitting: Allergy & Immunology

## 2020-10-26 ENCOUNTER — Other Ambulatory Visit: Payer: Self-pay | Admitting: Allergy & Immunology

## 2020-11-19 ENCOUNTER — Other Ambulatory Visit: Payer: Self-pay | Admitting: Allergy & Immunology

## 2020-12-02 ENCOUNTER — Other Ambulatory Visit: Payer: Self-pay | Admitting: Allergy & Immunology

## 2020-12-02 ENCOUNTER — Telehealth: Payer: Self-pay | Admitting: Allergy & Immunology

## 2020-12-02 NOTE — Telephone Encounter (Signed)
Patient informed that it has been over a year since he was seen, patient states he wants to wait until COVID slows down before coming into the office. Patient states he has no problem coming in after COVID.  Please advise

## 2020-12-02 NOTE — Telephone Encounter (Signed)
Left message to call back. We can do a telephone visit and will send courtesy refill until he can have the telephone visit. I have not signed off on the order for the refill yet.

## 2020-12-03 NOTE — Telephone Encounter (Signed)
Unable to reach patient and unable to leave a message.

## 2020-12-04 ENCOUNTER — Other Ambulatory Visit: Payer: Self-pay | Admitting: Allergy & Immunology

## 2020-12-04 MED ORDER — BUDESONIDE 0.25 MG/2ML IN SUSP: RESPIRATORY_TRACT | 0 refills | Status: DC

## 2020-12-04 NOTE — Telephone Encounter (Signed)
I have not been able to reach the patient about need for appointment. I will go ahead and send in courtesy refills with note to contact office.

## 2021-06-22 ENCOUNTER — Other Ambulatory Visit: Payer: Self-pay | Admitting: Allergy & Immunology

## 2021-06-23 ENCOUNTER — Other Ambulatory Visit: Payer: Self-pay | Admitting: Allergy & Immunology

## 2021-06-23 NOTE — Telephone Encounter (Signed)
Patient was last seen August of 2020 and has had 10 refill request since their last visit. Patient needs an appointment for further refills.

## 2021-08-13 ENCOUNTER — Other Ambulatory Visit: Payer: Self-pay | Admitting: Allergy & Immunology

## 2021-09-23 DIAGNOSIS — Z86018 Personal history of other benign neoplasm: Secondary | ICD-10-CM | POA: Diagnosis not present

## 2021-09-23 DIAGNOSIS — L570 Actinic keratosis: Secondary | ICD-10-CM | POA: Diagnosis not present

## 2021-09-23 DIAGNOSIS — L814 Other melanin hyperpigmentation: Secondary | ICD-10-CM | POA: Diagnosis not present

## 2021-09-23 DIAGNOSIS — Z23 Encounter for immunization: Secondary | ICD-10-CM | POA: Diagnosis not present

## 2021-09-23 DIAGNOSIS — D224 Melanocytic nevi of scalp and neck: Secondary | ICD-10-CM | POA: Diagnosis not present

## 2021-09-23 DIAGNOSIS — L821 Other seborrheic keratosis: Secondary | ICD-10-CM | POA: Diagnosis not present

## 2021-10-19 DIAGNOSIS — Z1211 Encounter for screening for malignant neoplasm of colon: Secondary | ICD-10-CM | POA: Diagnosis not present

## 2021-10-19 DIAGNOSIS — K5730 Diverticulosis of large intestine without perforation or abscess without bleeding: Secondary | ICD-10-CM | POA: Diagnosis not present

## 2021-10-19 DIAGNOSIS — D124 Benign neoplasm of descending colon: Secondary | ICD-10-CM | POA: Diagnosis not present

## 2021-10-19 DIAGNOSIS — K648 Other hemorrhoids: Secondary | ICD-10-CM | POA: Diagnosis not present

## 2021-10-27 DIAGNOSIS — I10 Essential (primary) hypertension: Secondary | ICD-10-CM | POA: Diagnosis not present

## 2021-10-27 DIAGNOSIS — Z125 Encounter for screening for malignant neoplasm of prostate: Secondary | ICD-10-CM | POA: Diagnosis not present

## 2021-10-27 DIAGNOSIS — K219 Gastro-esophageal reflux disease without esophagitis: Secondary | ICD-10-CM | POA: Diagnosis not present

## 2021-10-27 DIAGNOSIS — E782 Mixed hyperlipidemia: Secondary | ICD-10-CM | POA: Diagnosis not present

## 2021-10-27 DIAGNOSIS — Z0000 Encounter for general adult medical examination without abnormal findings: Secondary | ICD-10-CM | POA: Diagnosis not present

## 2022-01-05 ENCOUNTER — Other Ambulatory Visit: Payer: Self-pay

## 2022-01-05 ENCOUNTER — Encounter: Payer: Self-pay | Admitting: Allergy & Immunology

## 2022-01-05 ENCOUNTER — Ambulatory Visit: Payer: BC Managed Care – PPO | Admitting: Allergy & Immunology

## 2022-01-05 VITALS — BP 146/82 | HR 95 | Temp 97.7°F | Resp 16 | Ht 71.0 in | Wt 191.6 lb

## 2022-01-05 DIAGNOSIS — J302 Other seasonal allergic rhinitis: Secondary | ICD-10-CM

## 2022-01-05 MED ORDER — BUDESONIDE 0.25 MG/2ML IN SUSP: RESPIRATORY_TRACT | 11 refills | Status: AC

## 2022-01-05 MED ORDER — LEVOCETIRIZINE DIHYDROCHLORIDE 5 MG PO TABS: 5.0000 mg | ORAL_TABLET | Freq: Every evening | ORAL | 3 refills | Status: AC

## 2022-01-05 MED ORDER — IPRATROPIUM BROMIDE 0.06 % NA SOLN: 2.0000 | Freq: Three times a day (TID) | NASAL | 5 refills | Status: AC

## 2022-01-05 MED ORDER — MONTELUKAST SODIUM 10 MG PO TABS: ORAL_TABLET | ORAL | 11 refills | Status: DC

## 2022-01-05 NOTE — Patient Instructions (Addendum)
1. Seasonal and perennial allergic rhinitis - Continue with budesonide nasal rinses up to twice daily. - Continue with ipratropium up to three times daily (more useful for runny nose days). - Continue with levocetirizine 5 mg daily. - Continue with montelukast 10 mg daily.  2. Return in about 1 year (around 01/05/2023).    Please inform us of any Emergency Department visits, hospitalizations, or changes in symptoms. Call us before going to the ED for breathing or allergy symptoms since we might be able to fit you in for a sick visit. Feel free to contact us anytime with any questions, problems, or concerns.  It was a pleasure to see you again today, stranger!  Websites that have reliable patient information: 1. American Academy of Asthma, Allergy, and Immunology: www.aaaai.org 2. Food Allergy Research and Education (FARE): foodallergy.org 3. Mothers of Asthmatics: http://www.asthmacommunitynetwork.org 4. American College of Allergy, Asthma, and Immunology: www.acaai.org   COVID-19 Vaccine Information can be found at: [email or web address] For questions related to vaccine distribution or appointments, please email [email or web address] or call [phone].   We realize that you might be concerned about having an allergic reaction to the COVID19 vaccines. To help with that concern, WE ARE OFFERING THE COVID19 VACCINES IN OUR OFFICE! Ask the front desk for dates!     Like [place] on [employer] and Instagram for our latest updates!      A healthy democracy works best when [employer] participate! Make sure you are registered to vote! If you have moved or changed any of your contact information, you will need to get this updated before voting!  In some cases, you MAY be able to register to vote online: [email or web address]

## 2022-01-05 NOTE — Progress Notes (Signed)
FOLLOW UP  Date of Service/Encounter:  01/05/22   Assessment:   Seasonal and perennial allergic rhinitis (grasses, indoor molds, outdoor molds and dog)   Eustachian tube dysfunction   GERD  Plan/Recommendations:   1. Seasonal and perennial allergic rhinitis - Continue with budesonide nasal rinses up to twice daily. - Continue with ipratropium up to three times daily (more useful for runny nose days). - Continue with levocetirizine 5 mg daily. - Continue with montelukast 10 mg daily.  2. Return in about 1 year (around 01/05/2023).    Subjective:   Carlos Lindsey is a 61 y.o. male presenting today for follow up of  Chief Complaint  Patient presents with   Allergic Rhinitis     Sneezing since September/ October, continuous mild congestion   Medication Refill    Carlos Lindsey has a history of the following: Patient Active Problem List   Diagnosis Date Noted   Gastroesophageal reflux disease 07/19/2019   Hoarseness 07/19/2019   Mild intermittent asthma, uncomplicated 12/26/2018   Seasonal and perennial allergic rhinitis 12/26/2018    History obtained from: chart review and patient.  Carlos Lindsey is a 61 y.o. male presenting for a follow up visit.  He was last seen in August 2020.  At that time, we continue with budesonide nasal rinses up to twice daily.  We added nasal ipratropium every 8 hours as needed for runny nose.  We continue with Xyzal as well as montelukast and talked about allergy shots.  Reflux was controlled with lansoprazole 30 mg daily.  Since the last visit, he has done well.  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  This new job was for [employer], but he seems very happy with that right now.  Allergic Rhinitis Symptom History: He remains on the budesonide nasal rinses as well as ipratropium one  spray per nostril every 8 hours as needed. He is on levocetirizine and montelukast as well.  This combination is working really well.  He does not use all of these every day, but they have been able to keep him off of antibiotics.  He has been having a lot more symptoms since going to [facility] in September, but this is around the time that he ran out of all of his medications.  He has not needed any antibiotics whatsoever.  He is very happy with how he is doing.  GERD Symptom History: GERD is under good control with the PPI.  Otherwise, there have been no changes to his past medical history, surgical history, family history, or social history.    Review of Systems  Constitutional: Negative.  Negative for fever, malaise/fatigue and weight loss.  HENT:  Positive for congestion. Negative for ear discharge and ear pain.        Positive for postnasal drip.  Eyes:  Negative for pain, discharge and redness.  Respiratory:  Positive for cough. Negative for sputum production, shortness of breath and wheezing.   Cardiovascular: Negative.  Negative for chest pain and palpitations.  Gastrointestinal:  Negative for abdominal pain, constipation, diarrhea, heartburn, nausea and vomiting.  Skin: Negative.  Negative for itching and rash.  Neurological:  Negative for dizziness and headaches.  Endo/Heme/Allergies:  Negative for environmental allergies. Does not bruise/bleed easily.      Objective:   Blood pressure (!) 146/82, pulse 95,  temperature 97.7 F (36.5 C), temperature source Temporal, resp. rate 16, height 5\' 11"  (1.803 m), weight 191 lb 9.6 oz (86.9 kg), SpO2 94 %. Body mass index is 26.72 kg/m.   Physical Exam:  Physical Exam Vitals reviewed.  Constitutional:      Appearance: He is well-developed.     Comments: Talkative.  Pleasant.  HENT:     Head: Normocephalic and atraumatic.     Right Ear: Tympanic membrane, ear canal and external ear normal.     Left Ear: Tympanic membrane, ear  canal and external ear normal.     Nose: No nasal deformity, septal deviation, mucosal edema or rhinorrhea.     Right Turbinates: Not enlarged or swollen.     Left Turbinates: Not enlarged or swollen.     Right Sinus: No maxillary sinus tenderness or frontal sinus tenderness.     Left Sinus: No maxillary sinus tenderness or frontal sinus tenderness.     Mouth/Throat:     Mouth: Mucous membranes are not pale and not dry.     Pharynx: Uvula midline.  Eyes:     General: Lids are normal. No allergic shiner.       Right eye: No discharge.        Left eye: No discharge.     Conjunctiva/sclera: Conjunctivae normal.     Right eye: Right conjunctiva is not injected. No chemosis.    Left eye: Left conjunctiva is not injected. No chemosis.    Pupils: Pupils are equal, round, and reactive to light.  Cardiovascular:     Rate and Rhythm: Normal rate and regular rhythm.     Heart sounds: Normal heart sounds.  Pulmonary:     Effort: Pulmonary effort is normal. No tachypnea, accessory muscle usage or respiratory distress.     Breath sounds: Normal breath sounds. No wheezing, rhonchi or rales.  Chest:     Chest wall: No tenderness.  Lymphadenopathy:     Cervical: No cervical adenopathy.  Skin:    Coloration: Skin is not pale.     Findings: No abrasion, erythema, petechiae or rash. Rash is not papular, urticarial or vesicular.  Neurological:     Mental Status: He is alert.  Psychiatric:        Behavior: Behavior is cooperative.     Diagnostic studies: none      , MD  [facility]

## 2022-01-14 DIAGNOSIS — H52223 Regular astigmatism, bilateral: Secondary | ICD-10-CM | POA: Diagnosis not present

## 2022-03-25 DIAGNOSIS — J180 Bronchopneumonia, unspecified organism: Secondary | ICD-10-CM | POA: Diagnosis not present

## 2022-03-25 DIAGNOSIS — J029 Acute pharyngitis, unspecified: Secondary | ICD-10-CM | POA: Diagnosis not present

## 2022-03-25 DIAGNOSIS — Z20822 Contact with and (suspected) exposure to covid-19: Secondary | ICD-10-CM | POA: Diagnosis not present

## 2022-08-18 DIAGNOSIS — Z23 Encounter for immunization: Secondary | ICD-10-CM | POA: Diagnosis not present

## 2022-10-06 DIAGNOSIS — L570 Actinic keratosis: Secondary | ICD-10-CM | POA: Diagnosis not present

## 2022-10-06 DIAGNOSIS — L821 Other seborrheic keratosis: Secondary | ICD-10-CM | POA: Diagnosis not present

## 2022-10-06 DIAGNOSIS — D224 Melanocytic nevi of scalp and neck: Secondary | ICD-10-CM | POA: Diagnosis not present

## 2022-10-06 DIAGNOSIS — L814 Other melanin hyperpigmentation: Secondary | ICD-10-CM | POA: Diagnosis not present

## 2022-10-06 DIAGNOSIS — L578 Other skin changes due to chronic exposure to nonionizing radiation: Secondary | ICD-10-CM | POA: Diagnosis not present

## 2022-11-15 DIAGNOSIS — E782 Mixed hyperlipidemia: Secondary | ICD-10-CM | POA: Diagnosis not present

## 2022-11-15 DIAGNOSIS — Z0000 Encounter for general adult medical examination without abnormal findings: Secondary | ICD-10-CM | POA: Diagnosis not present

## 2022-11-15 DIAGNOSIS — Z125 Encounter for screening for malignant neoplasm of prostate: Secondary | ICD-10-CM | POA: Diagnosis not present

## 2022-11-15 DIAGNOSIS — R221 Localized swelling, mass and lump, neck: Secondary | ICD-10-CM | POA: Diagnosis not present

## 2022-11-15 DIAGNOSIS — I10 Essential (primary) hypertension: Secondary | ICD-10-CM | POA: Diagnosis not present

## 2022-11-18 ENCOUNTER — Other Ambulatory Visit: Payer: Self-pay | Admitting: Family Medicine

## 2022-11-18 DIAGNOSIS — R221 Localized swelling, mass and lump, neck: Secondary | ICD-10-CM

## 2022-11-25 ENCOUNTER — Ambulatory Visit
Admission: RE | Admit: 2022-11-25 | Discharge: 2022-11-25 | Disposition: A | Discharge status: Home / Self Care | Payer: BLUE CROSS/BLUE SHIELD | Source: Ambulatory Visit | Attending: Family Medicine | Admitting: Family Medicine

## 2022-11-25 DIAGNOSIS — R221 Localized swelling, mass and lump, neck: Secondary | ICD-10-CM | POA: Diagnosis not present

## 2022-12-14 DIAGNOSIS — D72829 Elevated white blood cell count, unspecified: Secondary | ICD-10-CM | POA: Diagnosis not present

## 2023-02-15 ENCOUNTER — Other Ambulatory Visit: Payer: Self-pay | Admitting: Allergy & Immunology

## 2023-03-22 DIAGNOSIS — H6991 Unspecified Eustachian tube disorder, right ear: Secondary | ICD-10-CM | POA: Diagnosis not present

## 2023-03-22 DIAGNOSIS — R238 Other skin changes: Secondary | ICD-10-CM | POA: Diagnosis not present

## 2023-03-22 DIAGNOSIS — K1120 Sialoadenitis, unspecified: Secondary | ICD-10-CM | POA: Diagnosis not present

## 2023-03-22 DIAGNOSIS — J358 Other chronic diseases of tonsils and adenoids: Secondary | ICD-10-CM | POA: Diagnosis not present

## 2023-04-19 DIAGNOSIS — H5203 Hypermetropia, bilateral: Secondary | ICD-10-CM | POA: Diagnosis not present

## 2023-06-18 DIAGNOSIS — R21 Rash and other nonspecific skin eruption: Secondary | ICD-10-CM | POA: Diagnosis not present

## 2023-06-18 DIAGNOSIS — W57XXXA Bitten or stung by nonvenomous insect and other nonvenomous arthropods, initial encounter: Secondary | ICD-10-CM | POA: Diagnosis not present

## 2023-10-11 DIAGNOSIS — L814 Other melanin hyperpigmentation: Secondary | ICD-10-CM | POA: Diagnosis not present

## 2023-10-11 DIAGNOSIS — Z86018 Personal history of other benign neoplasm: Secondary | ICD-10-CM | POA: Diagnosis not present

## 2023-10-11 DIAGNOSIS — L570 Actinic keratosis: Secondary | ICD-10-CM | POA: Diagnosis not present

## 2023-10-11 DIAGNOSIS — D224 Melanocytic nevi of scalp and neck: Secondary | ICD-10-CM | POA: Diagnosis not present

## 2023-10-11 DIAGNOSIS — L821 Other seborrheic keratosis: Secondary | ICD-10-CM | POA: Diagnosis not present

## 2023-11-02 DIAGNOSIS — R051 Acute cough: Secondary | ICD-10-CM | POA: Diagnosis not present

## 2023-11-02 DIAGNOSIS — Z760 Encounter for issue of repeat prescription: Secondary | ICD-10-CM | POA: Diagnosis not present

## 2023-11-02 DIAGNOSIS — J309 Allergic rhinitis, unspecified: Secondary | ICD-10-CM | POA: Diagnosis not present

## 2023-11-02 DIAGNOSIS — J029 Acute pharyngitis, unspecified: Secondary | ICD-10-CM | POA: Diagnosis not present

## 2023-11-28 DIAGNOSIS — N529 Male erectile dysfunction, unspecified: Secondary | ICD-10-CM | POA: Diagnosis not present

## 2023-11-28 DIAGNOSIS — Z79899 Other long term (current) drug therapy: Secondary | ICD-10-CM | POA: Diagnosis not present

## 2023-11-28 DIAGNOSIS — E782 Mixed hyperlipidemia: Secondary | ICD-10-CM | POA: Diagnosis not present

## 2023-11-28 DIAGNOSIS — K219 Gastro-esophageal reflux disease without esophagitis: Secondary | ICD-10-CM | POA: Diagnosis not present

## 2023-11-28 DIAGNOSIS — Z125 Encounter for screening for malignant neoplasm of prostate: Secondary | ICD-10-CM | POA: Diagnosis not present

## 2023-11-28 DIAGNOSIS — Z0000 Encounter for general adult medical examination without abnormal findings: Secondary | ICD-10-CM | POA: Diagnosis not present

## 2023-11-28 DIAGNOSIS — I10 Essential (primary) hypertension: Secondary | ICD-10-CM | POA: Diagnosis not present

## 2023-12-06 ENCOUNTER — Other Ambulatory Visit (HOSPITAL_COMMUNITY): Payer: Self-pay | Admitting: Family Medicine

## 2023-12-06 DIAGNOSIS — E782 Mixed hyperlipidemia: Secondary | ICD-10-CM

## 2023-12-23 ENCOUNTER — Ambulatory Visit (HOSPITAL_COMMUNITY)
Admission: RE | Admit: 2023-12-23 | Discharge: 2023-12-23 | Disposition: A | Discharge status: Home / Self Care | Payer: Self-pay | Source: Ambulatory Visit | Attending: Family Medicine | Admitting: Family Medicine

## 2023-12-23 DIAGNOSIS — E782 Mixed hyperlipidemia: Secondary | ICD-10-CM | POA: Insufficient documentation

## 2024-01-18 DIAGNOSIS — R946 Abnormal results of thyroid function studies: Secondary | ICD-10-CM | POA: Diagnosis not present

## 2024-03-06 DIAGNOSIS — J019 Acute sinusitis, unspecified: Secondary | ICD-10-CM | POA: Diagnosis not present

## 2024-03-06 DIAGNOSIS — R062 Wheezing: Secondary | ICD-10-CM | POA: Diagnosis not present

## 2024-03-06 DIAGNOSIS — R197 Diarrhea, unspecified: Secondary | ICD-10-CM | POA: Diagnosis not present

## 2024-05-17 DIAGNOSIS — H52223 Regular astigmatism, bilateral: Secondary | ICD-10-CM | POA: Diagnosis not present

## 2024-07-02 ENCOUNTER — Ambulatory Visit (HOSPITAL_BASED_OUTPATIENT_CLINIC_OR_DEPARTMENT_OTHER): Admitting: Internal Medicine

## 2024-07-02 VITALS — BP 124/72 | HR 94 | Ht 70.5 in | Wt 200.0 lb

## 2024-07-02 DIAGNOSIS — E038 Other specified hypothyroidism: Secondary | ICD-10-CM

## 2024-07-02 DIAGNOSIS — R931 Abnormal findings on diagnostic imaging of heart and coronary circulation: Secondary | ICD-10-CM | POA: Diagnosis not present

## 2024-07-02 DIAGNOSIS — E785 Hyperlipidemia, unspecified: Secondary | ICD-10-CM

## 2024-07-02 MED ORDER — AMLODIPINE BESYLATE 5 MG PO TABS: 5.0000 mg | ORAL_TABLET | Freq: Every day | ORAL | Status: AC

## 2024-07-02 MED ORDER — ATORVASTATIN CALCIUM 40 MG PO TABS: 40.0000 mg | ORAL_TABLET | Freq: Every day | ORAL | Status: AC

## 2024-07-02 NOTE — Patient Instructions (Signed)
 Medication Instructions:  Your physician recommends that you continue on your current medications as directed. Please refer to the Current Medication list given to you today.  *If you need a refill on your cardiac medications before your next appointment, please call your pharmacy*  Lab Work: FASTING- NMR and LPa as soon as able If you have labs (blood work) drawn today and your tests are completely normal, you will receive your results only by: MyChart Message (if you have MyChart) OR A paper copy in the mail If you have any lab test that is abnormal or we need to change your treatment, we will call you to review the results.  Follow-Up: At [facility], you and your health needs are our priority.  As part of our continuing mission to provide you with exceptional heart care, our providers are all part of one team.  This team includes your primary Cardiologist (physician) and Advanced Practice Providers or APPs (Physician Assistants and Nurse Practitioners) who all work together to provide you with the care you need, when you need it.  Your next appointment:   TBD- based on lab results

## 2024-07-02 NOTE — Progress Notes (Signed)
 LIPID CLINIC CONSULT NOTE  Chief Complaint:  Manage dyslipidemia  Primary Care Physician: Chrystal Lamarr RAMAN, MD  Primary Cardiologist:  None  HPI:  Carlos Lindsey is a 63 y.o. male who is being seen today for the evaluation of dyslipidemia at the request of Chrystal Lamarr RAMAN, *.  This is a pleasant 63 year old encounter referred for evaluation management of dyslipidemia.  He has a history of high cholesterol and has been on atorvastatin  40 mg daily as well as history of hypertension which is well-controlled.  He had a calcium  score in 12/2023 which showed a calcium  score of 200, 56 percentile for age and sex matched controls.  His most recent lipid profile in December showed total cholesterol 177, triglycerides 334, HDL 37 and LDL 85.  His goal LDL is less than 70.  He is on low-dose aspirin 81 mg daily.  He was also noted to have recent hypothyroidism.  He has been started on levothyroxine.  His last TSH was 4.7 in February.  He reports a varied diet sometimes with increases in saturated fat but is trying to work on weight loss and improvement in his diet.  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  PMHx:  Past Medical History:  Diagnosis Date   Angio-edema    Mild intermittent asthma, uncomplicated 12/26/2018    Past Surgical History:  Procedure Laterality Date   NASAL SEPTUM SURGERY N/A 2004   SINOSCOPY     uvula removal N/A 2004    FAMHx:  Family History  Problem Relation Age of Onset   Allergic rhinitis Mother    Asthma Father    Allergic rhinitis Brother     SOCHx:   reports that he has never smoked. He has never used smokeless tobacco. He reports that he does not currently use alcohol. No history on file for drug use.  ALLERGIES:  Allergies  Allergen Reactions   Lisinopril Swelling    Facial Swelling    Penicillins Diarrhea    ROS: Pertinent items noted in HPI and remainder of comprehensive ROS otherwise negative.  HOME MEDS: Current  Outpatient Medications on File Prior to Visit  Medication Sig Dispense Refill   albuterol (VENTOLIN HFA) 108 (90 Base) MCG/ACT inhaler 2 puffs every 4 (four) hours as needed. for wheezing     amLODipine -atorvastatin  (CADUET) 5-10 MG tablet Take 1 tablet by mouth daily.     ASPIRIN 81 PO      budesonide  (PULMICORT ) 0.25 MG/2ML nebulizer solution 1 vial twice daily as directed. 60 mL 11   EPINEPHrine  0.3 mg/0.3 mL IJ SOAJ injection Inject 0.3 mLs (0.3 mg total) into the muscle as needed for anaphylaxis. 2 Device 1   fluticasone  (FLONASE ) 50 MCG/ACT nasal spray Place into the nose.     ipratropium (ATROVENT ) 0.06 % nasal spray Place 2 sprays into both nostrils 3 (three) times daily. 15 mL 5   lansoprazole (PREVACID) 30 MG capsule Take 30 mg by mouth daily at 12 noon. Take 1 tablet every 2 days     levocetirizine (XYZAL ) 5 MG tablet Take 1 tablet (5 mg total) by mouth every evening. 90 tablet 3   levothyroxine (SYNTHROID) 50 MCG tablet Take 50 mcg by mouth daily before breakfast.     Melatonin 2.5 MG CHEW Chew 1 tablet by mouth as needed.     montelukast  (SINGULAIR ) 10 MG tablet TAKE 1 TABLET BY MOUTH EVERYDAY AT BEDTIME 30 tablet 0   Multiple Vitamin (MULTIVITAMINS PO) 1 tablet  Omega 3 1200 MG CAPS 1 capsule     pseudoephedrine-acetaminophen (TYLENOL SINUS) 30-500 MG TABS tablet Take 1 tablet by mouth daily.     QUERCETIN PO Take by mouth daily.     traZODone (DESYREL) 50 MG tablet Take 50 mg by mouth at bedtime as needed.     Zinc 100 MG TABS 1 tablet     No current facility-administered medications on file prior to visit.    LABS/IMAGING: No results found for this or any previous visit (from the past 48 hours). No results found.  LIPID PANEL: No results found for: CHOL, TRIG, HDL, CHOLHDL, VLDL, LDLCALC, LDLDIRECT  No results found for: LIPOA   WEIGHTS: Wt Readings from Last 3 Encounters:  07/02/24 200 lb (90.7 kg)  01/05/22 191 lb 9.6 oz (86.9 kg)  07/19/19  180 lb 9.6 oz (81.9 kg)    VITALS: BP 124/72   Pulse 94   Ht 5' 10.5 (1.791 m)   Wt 200 lb (90.7 kg)   SpO2 96%   BMI 28.29 kg/m   EXAM: Deferred  EKG: Deferred  ASSESSMENT: Dyslipidemia, goal is less than 70 CAC score of 200, 56 percentile (2025) Family history of high cholesterol  PLAN: 1.   Mr. Yankowski has a dyslipidemia and remains above LDL less than 70 on high-dose statin therapy.  I would like to repeat lipids since his last study was about 6 months ago.  His triglycerides were elevated as well.  This would be best is a fasting test and will recommend checking an LP(a) as well.  He may benefit from additional therapy including a possible PCSK9 inhibitor especially if his LP(a) is elevated.  He wishes to wait a few weeks to get the lab work drawn and I will then follow-up with him afterwards.  Thanks again for the kind referral.  Vinie KYM Maxcy, MD, [facility], FNLA, FACP  [place]  [facility]  Medical Director of the Advanced Lipid Disorders &  Cardiovascular Risk Reduction Clinic Diplomate of the American Board of Clinical Lipidology Attending Cardiologist  Direct Dial: [phone]  Fax: [phone]  Website:  [email or web address] Vinie BROCKS Ieasha Boerema 07/02/2024, 4:00 PM

## 2024-07-09 DIAGNOSIS — R931 Abnormal findings on diagnostic imaging of heart and coronary circulation: Secondary | ICD-10-CM | POA: Diagnosis not present

## 2024-07-10 ENCOUNTER — Ambulatory Visit: Payer: Self-pay | Admitting: Internal Medicine

## 2024-07-10 LAB — NMR, LIPOPROFILE
Cholesterol, Total: 184 mg/dL (ref 100–199)
HDL Particle Number: 28.4 umol/L — ABNORMAL LOW (ref >=30.5)
HDL-C: 25 mg/dL — ABNORMAL LOW (ref >39)
LDL Particle Number: 1233 nmol/L — ABNORMAL HIGH (ref <1000)
LDL Size: 19.5 nm — ABNORMAL LOW (ref >20.5)
LDL-C (NIH Calc): 58 mg/dL (ref 0–99)
LP-IR Score: 92 — ABNORMAL HIGH (ref <=45)
Small LDL Particle Number: 1052 nmol/L — ABNORMAL HIGH (ref <=527)
Triglycerides: 681 mg/dL (ref 0–149)

## 2024-07-10 LAB — LIPOPROTEIN A (LPA): Lipoprotein (a): <8.4 nmol/L (ref <75.0)

## 2024-07-20 ENCOUNTER — Other Ambulatory Visit (HOSPITAL_COMMUNITY): Payer: Self-pay

## 2024-07-20 ENCOUNTER — Telehealth: Payer: Self-pay | Admitting: Pharmacy Technician

## 2024-07-20 DIAGNOSIS — E785 Hyperlipidemia, unspecified: Secondary | ICD-10-CM

## 2024-07-20 NOTE — Telephone Encounter (Signed)
 From staff messages   Brand preferred over generic per test claim  Pharmacy Patient Advocate Encounter   Received notification from staff messages that prior authorization for vascepa 1gm is required/requested.   Insurance verification completed.   The patient is insured through [facility] .   Per test claim: PA required; PA submitted to above mentioned insurance via latent Key/confirmation #/[id number] Status is pending

## 2024-07-20 NOTE — Telephone Encounter (Signed)
 Pharmacy Patient Advocate Encounter  Received notification from [facility] that Prior Authorization for vascepa 1gm has been APPROVED from 07/20/24 to 07/20/25. Ran test claim, Copay is $4.00. This test claim was processed through [facility]- copay amounts may vary at other pharmacies due to pharmacy/plan contracts, or as the patient moves through the different stages of their insurance plan.   PA #/Case ID/Reference #: [id number]

## 2024-07-24 MED ORDER — ICOSAPENT ETHYL 1 G PO CAPS: 2.0000 g | ORAL_CAPSULE | Freq: Two times a day (BID) | ORAL | 3 refills | Status: AC

## 2024-07-24 NOTE — Addendum Note (Signed)
 Addended by: ANDREZ PRAIRIE on: 07/24/2024 01:57 PM   Modules accepted: Orders

## 2024-07-24 NOTE — Telephone Encounter (Signed)
 Called patient. Verified name and DOB. Relayed lab results and Dr. Katharina recommendations. Informed patient of Vascepa  PA approval and cost. Patient agreeable to plan and advised on preferred pharmacy. Rx sent electronically and labs ordered.   Josie RN

## 2024-08-10 DIAGNOSIS — L309 Dermatitis, unspecified: Secondary | ICD-10-CM | POA: Diagnosis not present

## 2024-09-06 DIAGNOSIS — L259 Unspecified contact dermatitis, unspecified cause: Secondary | ICD-10-CM | POA: Diagnosis not present

## 2024-10-31 DIAGNOSIS — Z86018 Personal history of other benign neoplasm: Secondary | ICD-10-CM | POA: Diagnosis not present

## 2024-10-31 DIAGNOSIS — L814 Other melanin hyperpigmentation: Secondary | ICD-10-CM | POA: Diagnosis not present

## 2024-10-31 DIAGNOSIS — D0439 Carcinoma in situ of skin of other parts of face: Secondary | ICD-10-CM | POA: Diagnosis not present

## 2024-10-31 DIAGNOSIS — L821 Other seborrheic keratosis: Secondary | ICD-10-CM | POA: Diagnosis not present

## 2024-10-31 DIAGNOSIS — D224 Melanocytic nevi of scalp and neck: Secondary | ICD-10-CM | POA: Diagnosis not present

## 2024-10-31 DIAGNOSIS — D485 Neoplasm of uncertain behavior of skin: Secondary | ICD-10-CM | POA: Diagnosis not present

## 2024-11-28 DIAGNOSIS — Z0000 Encounter for general adult medical examination without abnormal findings: Secondary | ICD-10-CM | POA: Diagnosis not present

## 2024-11-28 DIAGNOSIS — Z79899 Other long term (current) drug therapy: Secondary | ICD-10-CM | POA: Diagnosis not present

## 2024-11-28 DIAGNOSIS — I10 Essential (primary) hypertension: Secondary | ICD-10-CM | POA: Diagnosis not present

## 2024-11-28 DIAGNOSIS — E782 Mixed hyperlipidemia: Secondary | ICD-10-CM | POA: Diagnosis not present

## 2024-11-28 DIAGNOSIS — K219 Gastro-esophageal reflux disease without esophagitis: Secondary | ICD-10-CM | POA: Diagnosis not present

## 2024-11-28 DIAGNOSIS — Z125 Encounter for screening for malignant neoplasm of prostate: Secondary | ICD-10-CM | POA: Diagnosis not present

## 2024-11-28 DIAGNOSIS — R253 Fasciculation: Secondary | ICD-10-CM | POA: Diagnosis not present
